# Patient Record
Sex: Male | Born: 1970 | Race: White | Hispanic: No | Marital: Single | Smoking: Former smoker
Health system: Southern US, Community
[De-identification: ages and names within clinical notes are randomized; demographics above are authoritative.]

## PROBLEM LIST (undated history)

## (undated) DIAGNOSIS — I10 Essential (primary) hypertension: Secondary | ICD-10-CM

## (undated) DIAGNOSIS — E785 Hyperlipidemia, unspecified: Secondary | ICD-10-CM

## (undated) HISTORY — PX: FRACTURE SURGERY: SHX138

## (undated) HISTORY — PX: HERNIA REPAIR: SHX51

---

## 2008-08-05 ENCOUNTER — Emergency Department: Payer: Self-pay | Admitting: Emergency Medicine

## 2013-09-21 ENCOUNTER — Ambulatory Visit: Payer: Self-pay | Admitting: Internal Medicine

## 2013-10-03 ENCOUNTER — Ambulatory Visit: Payer: Self-pay | Admitting: Internal Medicine

## 2013-10-03 LAB — HEPATIC FUNCTION PANEL A (ARMC)
ALBUMIN: 4.2 g/dL (ref 3.4–5.0)
AST: 40 U/L — AB (ref 15–37)
Alkaline Phosphatase: 83 U/L
Bilirubin, Direct: <0.1 mg/dL (ref 0.00–0.20)
Bilirubin,Total: 0.4 mg/dL (ref 0.2–1.0)
SGPT (ALT): 71 U/L (ref 12–78)
TOTAL PROTEIN: 7.7 g/dL (ref 6.4–8.2)

## 2013-10-03 LAB — CBC CANCER CENTER
Basophil #: 0 x10 3/mm (ref 0.0–0.1)
Basophil %: 0.4 %
EOS ABS: 0.1 x10 3/mm (ref 0.0–0.7)
Eosinophil %: 2 %
HCT: 39 % — ABNORMAL LOW (ref 40.0–52.0)
HGB: 13.3 g/dL (ref 13.0–18.0)
Lymphocyte #: 1.8 x10 3/mm (ref 1.0–3.6)
Lymphocyte %: 24.8 %
MCH: 31.4 pg (ref 26.0–34.0)
MCHC: 34.1 g/dL (ref 32.0–36.0)
MCV: 92 fL (ref 80–100)
MONOS PCT: 7.2 %
Monocyte #: 0.5 x10 3/mm (ref 0.2–1.0)
NEUTROS ABS: 4.8 x10 3/mm (ref 1.4–6.5)
NEUTROS PCT: 65.6 %
PLATELETS: 691 x10 3/mm — AB (ref 150–440)
RBC: 4.24 10*6/uL — AB (ref 4.40–5.90)
RDW: 15.5 % — ABNORMAL HIGH (ref 11.5–14.5)
WBC: 7.4 x10 3/mm (ref 3.8–10.6)

## 2013-10-03 LAB — SEDIMENTATION RATE: ERYTHROCYTE SED RATE: 11 mm/h (ref 0–15)

## 2013-10-03 LAB — IRON AND TIBC
IRON BIND. CAP.(TOTAL): 385 ug/dL (ref 250–450)
Iron Saturation: 21 %
Iron: 80 ug/dL (ref 65–175)
UNBOUND IRON-BIND. CAP.: 305 ug/dL

## 2013-10-03 LAB — FOLATE: Folic Acid: 9.5 ng/mL (ref 3.1–100.0)

## 2013-10-03 LAB — CREATININE, SERUM
Creatinine: 0.98 mg/dL (ref 0.60–1.30)
EGFR (Non-African Amer.): >60

## 2013-10-03 LAB — FERRITIN: Ferritin (ARMC): 127 ng/mL (ref 8–388)

## 2013-10-25 ENCOUNTER — Ambulatory Visit: Payer: Self-pay | Admitting: Internal Medicine

## 2014-01-13 ENCOUNTER — Ambulatory Visit: Payer: Self-pay | Admitting: Nurse Practitioner

## 2014-04-02 ENCOUNTER — Ambulatory Visit: Payer: Self-pay | Admitting: Physician Assistant

## 2019-04-06 ENCOUNTER — Other Ambulatory Visit: Payer: Self-pay | Admitting: Sports Medicine

## 2019-04-06 DIAGNOSIS — M25562 Pain in left knee: Secondary | ICD-10-CM

## 2019-04-06 DIAGNOSIS — M25462 Effusion, left knee: Secondary | ICD-10-CM

## 2019-04-06 DIAGNOSIS — M76892 Other specified enthesopathies of left lower limb, excluding foot: Secondary | ICD-10-CM

## 2019-04-17 ENCOUNTER — Other Ambulatory Visit: Payer: Self-pay

## 2019-04-17 ENCOUNTER — Ambulatory Visit
Admission: RE | Admit: 2019-04-17 | Discharge: 2019-04-17 | Disposition: A | Discharge status: Home / Self Care | Payer: BC Managed Care – PPO | Source: Ambulatory Visit | Attending: Sports Medicine | Admitting: Sports Medicine

## 2019-04-17 DIAGNOSIS — M76892 Other specified enthesopathies of left lower limb, excluding foot: Secondary | ICD-10-CM | POA: Insufficient documentation

## 2019-04-17 DIAGNOSIS — M25562 Pain in left knee: Secondary | ICD-10-CM | POA: Insufficient documentation

## 2019-04-17 DIAGNOSIS — M25462 Effusion, left knee: Secondary | ICD-10-CM | POA: Diagnosis present

## 2019-08-09 ENCOUNTER — Ambulatory Visit
Admission: EM | Admit: 2019-08-09 | Discharge: 2019-08-09 | Discharge status: Against Medical Advice | Payer: BC Managed Care – PPO | Attending: Emergency Medicine | Admitting: Emergency Medicine

## 2019-08-09 ENCOUNTER — Encounter: Payer: Self-pay | Admitting: Emergency Medicine

## 2019-08-09 ENCOUNTER — Other Ambulatory Visit: Payer: Self-pay

## 2019-08-09 DIAGNOSIS — Z5321 Procedure and treatment not carried out due to patient leaving prior to being seen by health care provider: Secondary | ICD-10-CM | POA: Insufficient documentation

## 2019-08-09 DIAGNOSIS — Z20822 Contact with and (suspected) exposure to covid-19: Secondary | ICD-10-CM | POA: Insufficient documentation

## 2019-08-09 HISTORY — DX: Essential (primary) hypertension: I10

## 2019-08-09 HISTORY — DX: Hyperlipidemia, unspecified: E78.5

## 2019-08-09 NOTE — ED Triage Notes (Addendum)
Patient in today c/o cough and runny nose off & on x 2 weeks. Patient has had a +covid exposure from his father on 08/05/19. Patient states he has to have a covid test prior to returning to work.

## 2019-08-09 NOTE — ED Provider Notes (Signed)
Patient left prior to me seeing him.   Domenick Gong, MD 08/09/19 1433

## 2019-08-10 LAB — NOVEL CORONAVIRUS, NAA (HOSP ORDER, SEND-OUT TO REF LAB; TAT 18-24 HRS): SARS-CoV-2, NAA: NOT DETECTED

## 2021-01-16 IMAGING — MR MR KNEE*L* W/O CM
7 series · 40 of 40 positions shown · non-contrast
Comparison: None.

CLINICAL DATA: Pain above the left patella

EXAM:
MRI OF THE LEFT KNEE WITHOUT CONTRAST
TECHNIQUE: Multiplanar, multisequence MR imaging of the knee was performed. No
intravenous contrast was administered.

[Series 15: T2 fat-sat · axial · left · 4.0mm · 0.50mm/px · z∈[-76,+79]mm · 7 of 32 slices shown (1 of 3)]
[im 1/32]
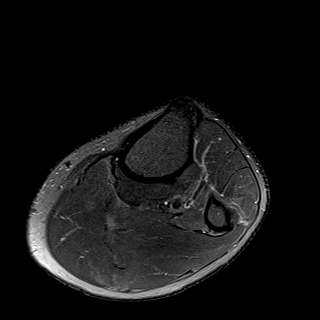
[im 6/32]
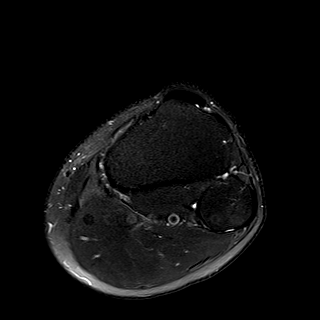
[im 11/32]
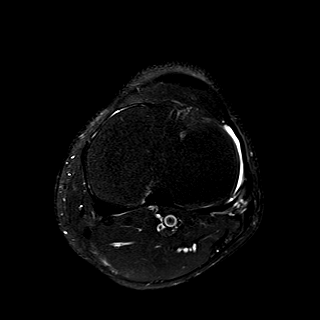
[im 16/32]
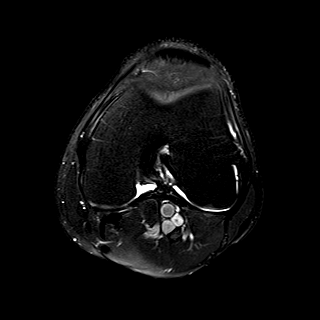
[im 21/32]
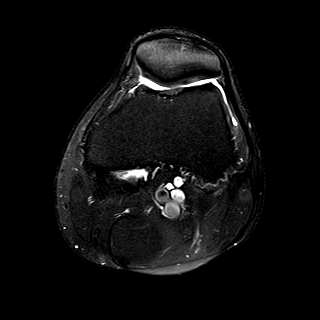
[im 26/32]
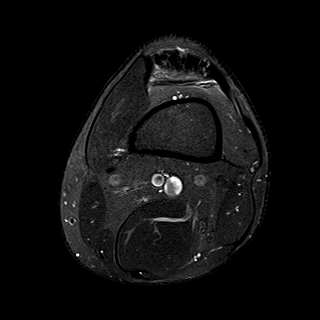
[im 32/32]
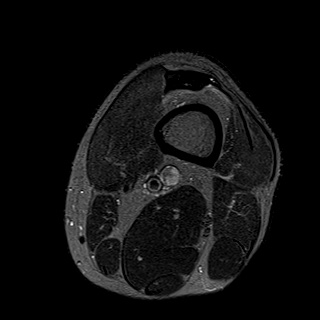

[Series 16: T1 · coronal · left · 4.0mm · 0.42mm/px · 6 of 32 slices shown]
[im 1/32]
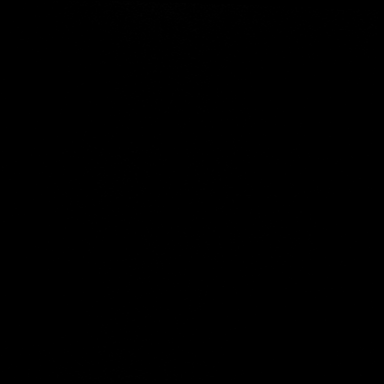
[im 7/32]
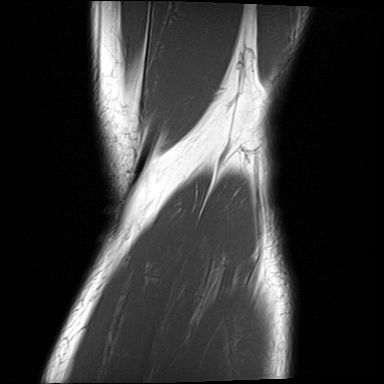
[im 13/32]
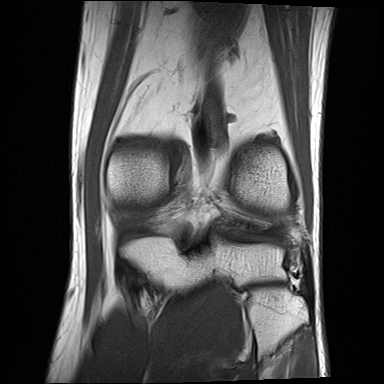
[im 19/32]
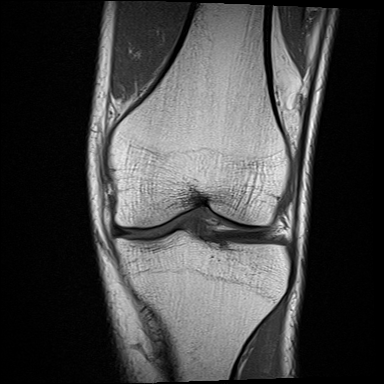
[im 25/32]
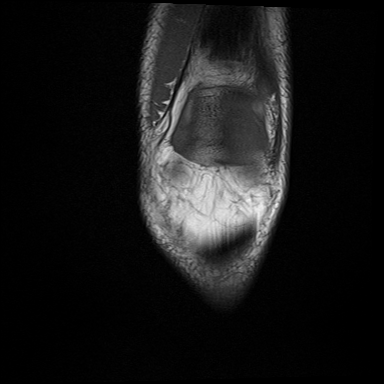
[im 32/32]
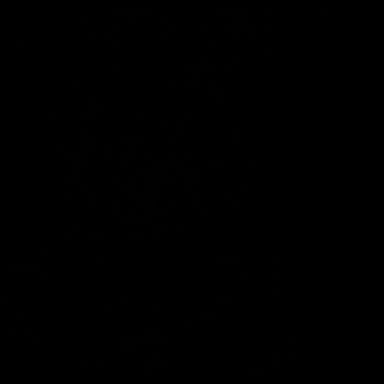

[Series 17: T2 fat-sat · coronal · left · 4.0mm · 0.59mm/px · 6 of 32 slices shown (2 of 3)]
[im 1/32]
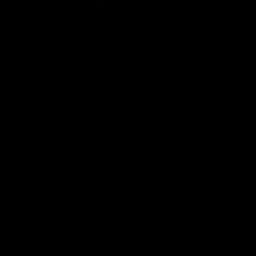
[im 7/32]
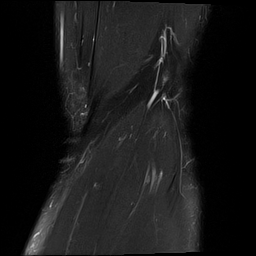
[im 13/32]
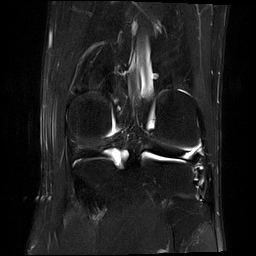
[im 19/32]
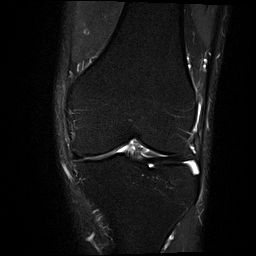
[im 25/32]
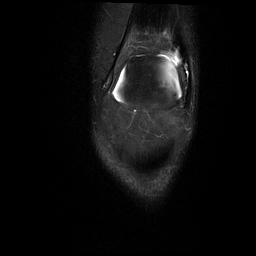
[im 32/32]
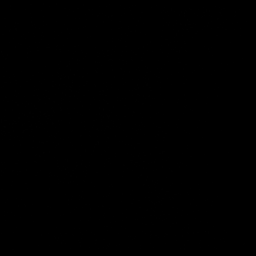

[Series 18: PD fat-sat · coronal · left · 4.0mm · 0.59mm/px · 6 of 32 slices shown (1 of 2)]
[im 1/32]
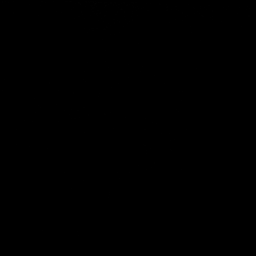
[im 7/32]
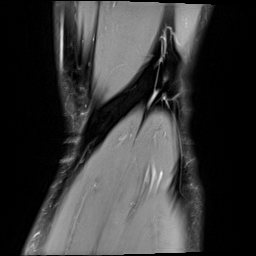
[im 13/32]
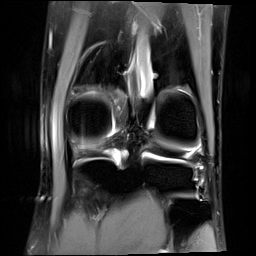
[im 19/32]
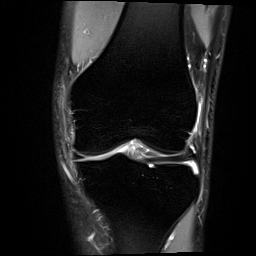
[im 25/32]
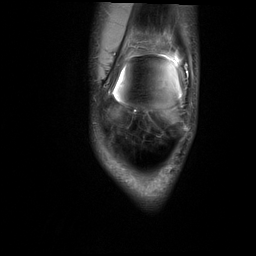
[im 32/32]
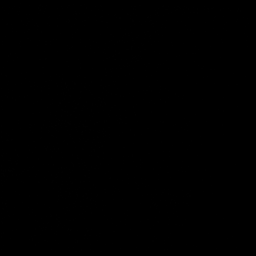

[Series 19: PD fat-sat · sagittal · left · 3.0mm · 0.59mm/px · 6 of 30 slices shown (2 of 2)]
[im 1/30]
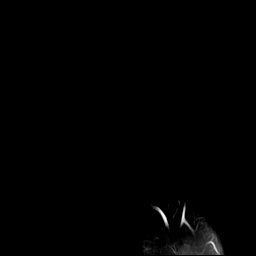
[im 6/30]
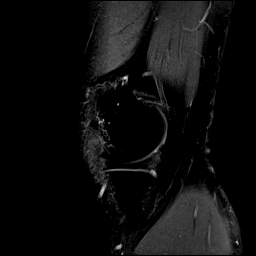
[im 12/30]
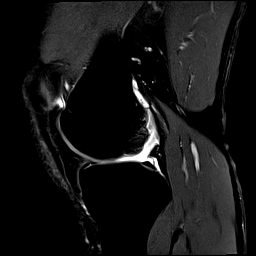
[im 18/30]
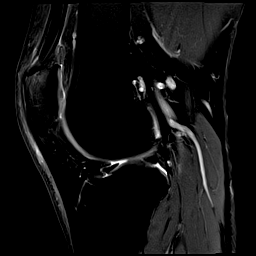
[im 24/30]
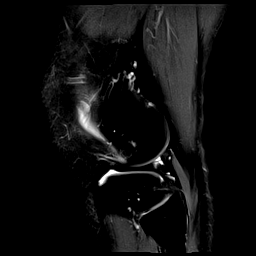
[im 30/30]
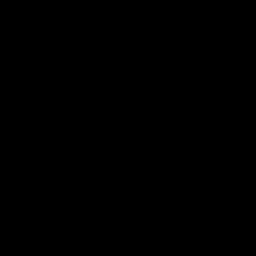

[Series 20: T2 fat-sat · sagittal · left · 3.0mm · 0.59mm/px · 6 of 30 slices shown (3 of 3)]
[im 1/30]
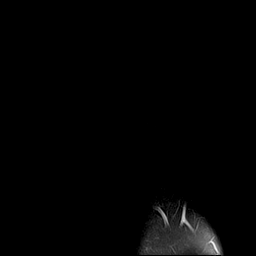
[im 6/30]
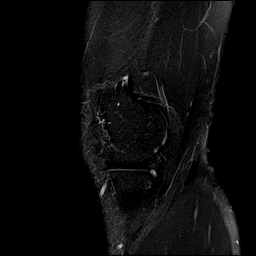
[im 12/30]
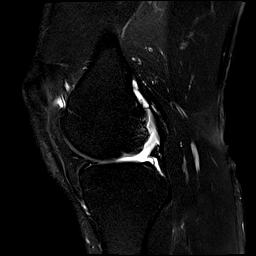
[im 18/30]
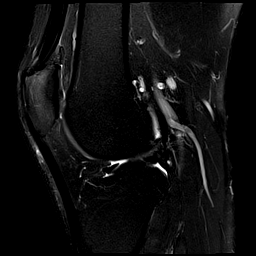
[im 24/30]
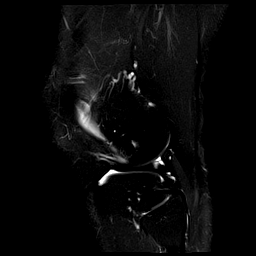
[im 30/30]
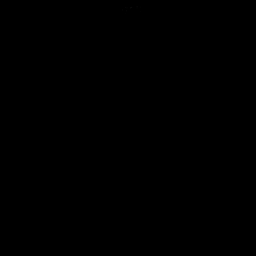

[Series 21: PD · coronal · left · 2.0mm · 0.47mm/px · 3 of 16 slices shown]
[im 1/16]
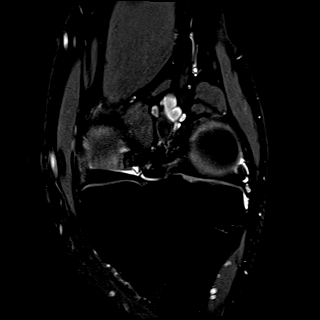
[im 8/16]
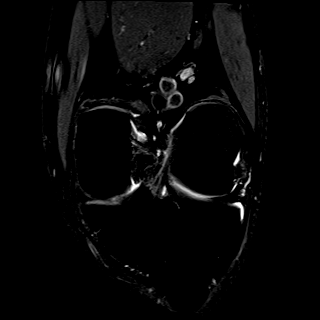
[im 16/16]
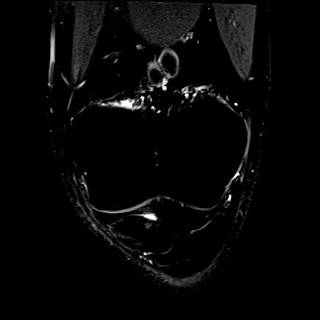

[40 of 40 positions shown; findings below may reference images not displayed]

FINDINGS: MENISCI

Medial: Intact.

Lateral: Intact.

LIGAMENTS

Cruciates: The ACL is intact. The PCL is intact.

Collaterals: The MCL is intact. The lateral collateral ligamentous
complex is intact.

CARTILAGE

Patellofemoral: Normal.

Medial compartment: There is mild chondral fissuring seen the
weight-bearing surface of the medial femoral condyle medial tibial
plateau.

Lateral compartment: Normal.

BONES: Increased marrow signal seen at the superior patellar pole
and anterior patella. No osseous fracture. No avascular necrosis. No
pathologic marrow infiltration.

JOINT: There is a small knee joint effusion. Normal Irael Orr.
No plical thickening.

EXTENSOR MECHANISM: Increased intrasubstance signal and thickening
seen within the insertion site of the quadriceps tendon. Prepatellar
subcutaneous edema is noted. The retinaculum is unremarkable.

POPLITEAL FOSSA: No popliteal cyst.

OTHER:  The visualized muscles are normal in appearance.
IMPRESSION: Insertional quadriceps tendinosis with adjacent reactive marrow in
the patella.

Mild prepatellar subcutaneous edema.

Small knee joint effusion

Mild medial compartment chondral disease
